# Patient Record
Sex: Female | Born: 1955 | Race: White | Hispanic: No | Marital: Married | Smoking: Current every day smoker
Health system: Southern US, Community
[De-identification: ages and names within clinical notes are randomized; demographics above are authoritative.]

## PROBLEM LIST (undated history)

## (undated) DIAGNOSIS — F419 Anxiety disorder, unspecified: Secondary | ICD-10-CM

## (undated) DIAGNOSIS — E079 Disorder of thyroid, unspecified: Secondary | ICD-10-CM

## (undated) DIAGNOSIS — E559 Vitamin D deficiency, unspecified: Secondary | ICD-10-CM

## (undated) DIAGNOSIS — C801 Malignant (primary) neoplasm, unspecified: Secondary | ICD-10-CM

## (undated) DIAGNOSIS — D219 Benign neoplasm of connective and other soft tissue, unspecified: Secondary | ICD-10-CM

## (undated) DIAGNOSIS — M8580 Other specified disorders of bone density and structure, unspecified site: Secondary | ICD-10-CM

## (undated) HISTORY — PX: HAND SURGERY: SHX662

## (undated) HISTORY — PX: CATARACT EXTRACTION, BILATERAL: SHX1313

## (undated) HISTORY — PX: BREAST BIOPSY: SHX20

## (undated) HISTORY — DX: Disorder of thyroid, unspecified: E07.9

## (undated) HISTORY — PX: BREAST SURGERY: SHX581

## (undated) HISTORY — PX: ABDOMINAL SURGERY: SHX537

## (undated) HISTORY — DX: Anxiety disorder, unspecified: F41.9

## (undated) HISTORY — DX: Other specified disorders of bone density and structure, unspecified site: M85.80

## (undated) HISTORY — DX: Malignant (primary) neoplasm, unspecified: C80.1

## (undated) HISTORY — DX: Vitamin D deficiency, unspecified: E55.9

## (undated) HISTORY — PX: DILATION AND CURETTAGE OF UTERUS: SHX78

## (undated) HISTORY — DX: Benign neoplasm of connective and other soft tissue, unspecified: D21.9

## (undated) HISTORY — PX: TONSILLECTOMY AND ADENOIDECTOMY: SUR1326

---

## 1997-08-31 ENCOUNTER — Ambulatory Visit (HOSPITAL_COMMUNITY): Admission: RE | Admit: 1997-08-31 | Discharge: 1997-08-31 | Payer: Self-pay | Admitting: *Deleted

## 2000-10-18 ENCOUNTER — Encounter: Payer: Self-pay | Admitting: *Deleted

## 2000-10-18 ENCOUNTER — Ambulatory Visit (HOSPITAL_COMMUNITY): Admission: RE | Admit: 2000-10-18 | Discharge: 2000-10-18 | Payer: Self-pay | Admitting: *Deleted

## 2000-12-04 ENCOUNTER — Other Ambulatory Visit: Admission: RE | Admit: 2000-12-04 | Discharge: 2000-12-04 | Payer: Self-pay | Admitting: *Deleted

## 2002-03-16 ENCOUNTER — Ambulatory Visit (HOSPITAL_COMMUNITY): Admission: RE | Admit: 2002-03-16 | Discharge: 2002-03-16 | Payer: Self-pay | Admitting: *Deleted

## 2002-03-16 ENCOUNTER — Encounter: Payer: Self-pay | Admitting: *Deleted

## 2002-03-26 ENCOUNTER — Encounter: Admission: RE | Admit: 2002-03-26 | Discharge: 2002-03-26 | Payer: Self-pay | Admitting: *Deleted

## 2002-03-26 ENCOUNTER — Encounter: Payer: Self-pay | Admitting: *Deleted

## 2002-03-26 ENCOUNTER — Other Ambulatory Visit: Admission: RE | Admit: 2002-03-26 | Discharge: 2002-03-26 | Payer: Self-pay | Admitting: *Deleted

## 2003-03-31 ENCOUNTER — Other Ambulatory Visit: Admission: RE | Admit: 2003-03-31 | Discharge: 2003-03-31 | Payer: Self-pay | Admitting: *Deleted

## 2003-05-21 ENCOUNTER — Encounter: Admission: RE | Admit: 2003-05-21 | Discharge: 2003-05-21 | Payer: Self-pay | Admitting: *Deleted

## 2003-05-21 ENCOUNTER — Encounter: Payer: Self-pay | Admitting: *Deleted

## 2005-01-22 ENCOUNTER — Ambulatory Visit (HOSPITAL_COMMUNITY): Admission: RE | Admit: 2005-01-22 | Discharge: 2005-01-22 | Payer: Self-pay | Admitting: *Deleted

## 2005-02-01 ENCOUNTER — Other Ambulatory Visit: Admission: RE | Admit: 2005-02-01 | Discharge: 2005-02-01 | Payer: Self-pay | Admitting: *Deleted

## 2006-07-31 ENCOUNTER — Other Ambulatory Visit: Admission: RE | Admit: 2006-07-31 | Discharge: 2006-07-31 | Payer: Self-pay | Admitting: Obstetrics and Gynecology

## 2006-08-14 ENCOUNTER — Ambulatory Visit (HOSPITAL_COMMUNITY): Admission: RE | Admit: 2006-08-14 | Discharge: 2006-08-14 | Payer: Self-pay | Admitting: Obstetrics and Gynecology

## 2007-11-03 ENCOUNTER — Encounter: Admission: RE | Admit: 2007-11-03 | Discharge: 2007-11-03 | Payer: Self-pay | Admitting: Obstetrics and Gynecology

## 2008-05-18 ENCOUNTER — Other Ambulatory Visit: Admission: RE | Admit: 2008-05-18 | Discharge: 2008-05-18 | Payer: Self-pay | Admitting: Obstetrics and Gynecology

## 2008-06-30 ENCOUNTER — Ambulatory Visit: Payer: Self-pay | Admitting: Family Medicine

## 2008-06-30 DIAGNOSIS — S6390XA Sprain of unspecified part of unspecified wrist and hand, initial encounter: Secondary | ICD-10-CM | POA: Insufficient documentation

## 2010-05-02 ENCOUNTER — Encounter: Admission: RE | Admit: 2010-05-02 | Discharge: 2010-05-02 | Payer: Self-pay | Admitting: Obstetrics and Gynecology

## 2011-11-20 ENCOUNTER — Other Ambulatory Visit: Payer: Self-pay | Admitting: Family Medicine

## 2011-11-20 DIAGNOSIS — Z0000 Encounter for general adult medical examination without abnormal findings: Secondary | ICD-10-CM

## 2011-11-27 ENCOUNTER — Ambulatory Visit
Admission: RE | Admit: 2011-11-27 | Discharge: 2011-11-27 | Disposition: A | Discharge status: Home / Self Care | Payer: BC Managed Care – PPO | Source: Ambulatory Visit | Attending: Family Medicine | Admitting: Family Medicine

## 2011-11-27 DIAGNOSIS — Z0000 Encounter for general adult medical examination without abnormal findings: Secondary | ICD-10-CM

## 2013-04-15 ENCOUNTER — Other Ambulatory Visit: Payer: Self-pay | Admitting: Family Medicine

## 2013-04-15 DIAGNOSIS — Z1231 Encounter for screening mammogram for malignant neoplasm of breast: Secondary | ICD-10-CM

## 2013-04-23 ENCOUNTER — Ambulatory Visit (INDEPENDENT_AMBULATORY_CARE_PROVIDER_SITE_OTHER): Payer: BC Managed Care – PPO

## 2013-04-23 DIAGNOSIS — Z1231 Encounter for screening mammogram for malignant neoplasm of breast: Secondary | ICD-10-CM

## 2013-08-06 ENCOUNTER — Telehealth: Payer: Self-pay | Admitting: Obstetrics and Gynecology

## 2013-08-06 NOTE — Telephone Encounter (Signed)
Refill request for DIAZEPAM Last filled by MD on - 08/29/12 #30 X 0 RF Last AEX - 08/29/12 with Dr. Joan Flores Next AEX - cancelled appt for 09/04/13 with Dr. Quincy Simmonds, did not reschedule. Please advise refills.

## 2013-08-06 NOTE — Telephone Encounter (Signed)
I spoke to the patient and advised she will need to schedule AEX in order to receive refills.  Pt states she is switching her care over to her PCP and will get RX from them.

## 2013-08-06 NOTE — Telephone Encounter (Signed)
Pharmacy calling for refill on pt's diazepam. [facility] in [place] at [phone].

## 2013-08-06 NOTE — Telephone Encounter (Signed)
Please contact patient to reschedule annual exam. If she does not have an appointment to continue her care here, I cannot refill.

## 2013-09-04 ENCOUNTER — Ambulatory Visit: Payer: Self-pay | Admitting: Obstetrics and Gynecology

## 2014-12-29 ENCOUNTER — Other Ambulatory Visit: Payer: Self-pay | Admitting: Family Medicine

## 2014-12-29 DIAGNOSIS — Z9289 Personal history of other medical treatment: Secondary | ICD-10-CM

## 2014-12-30 ENCOUNTER — Ambulatory Visit (INDEPENDENT_AMBULATORY_CARE_PROVIDER_SITE_OTHER): Payer: BLUE CROSS/BLUE SHIELD

## 2014-12-30 DIAGNOSIS — Z1231 Encounter for screening mammogram for malignant neoplasm of breast: Secondary | ICD-10-CM | POA: Diagnosis not present

## 2014-12-30 DIAGNOSIS — Z9289 Personal history of other medical treatment: Secondary | ICD-10-CM

## 2016-01-03 ENCOUNTER — Other Ambulatory Visit: Payer: Self-pay | Admitting: Family Medicine

## 2016-01-03 DIAGNOSIS — Z1231 Encounter for screening mammogram for malignant neoplasm of breast: Secondary | ICD-10-CM

## 2016-01-04 ENCOUNTER — Ambulatory Visit (INDEPENDENT_AMBULATORY_CARE_PROVIDER_SITE_OTHER): Payer: BLUE CROSS/BLUE SHIELD

## 2016-01-04 DIAGNOSIS — Z1231 Encounter for screening mammogram for malignant neoplasm of breast: Secondary | ICD-10-CM

## 2016-02-29 ENCOUNTER — Ambulatory Visit (INDEPENDENT_AMBULATORY_CARE_PROVIDER_SITE_OTHER): Payer: BLUE CROSS/BLUE SHIELD | Admitting: Obstetrics and Gynecology

## 2016-02-29 ENCOUNTER — Encounter: Payer: Self-pay | Admitting: Obstetrics and Gynecology

## 2016-02-29 VITALS — BP 132/60 | HR 84 | Ht 65.0 in | Wt 136.0 lb

## 2016-02-29 DIAGNOSIS — M8580 Other specified disorders of bone density and structure, unspecified site: Secondary | ICD-10-CM | POA: Diagnosis not present

## 2016-02-29 DIAGNOSIS — Z124 Encounter for screening for malignant neoplasm of cervix: Secondary | ICD-10-CM

## 2016-02-29 DIAGNOSIS — E785 Hyperlipidemia, unspecified: Secondary | ICD-10-CM | POA: Insufficient documentation

## 2016-02-29 DIAGNOSIS — Z720 Tobacco use: Secondary | ICD-10-CM | POA: Diagnosis not present

## 2016-02-29 DIAGNOSIS — Z01419 Encounter for gynecological examination (general) (routine) without abnormal findings: Secondary | ICD-10-CM | POA: Diagnosis not present

## 2016-02-29 DIAGNOSIS — C801 Malignant (primary) neoplasm, unspecified: Secondary | ICD-10-CM | POA: Insufficient documentation

## 2016-02-29 DIAGNOSIS — E079 Disorder of thyroid, unspecified: Secondary | ICD-10-CM | POA: Insufficient documentation

## 2016-02-29 DIAGNOSIS — F17200 Nicotine dependence, unspecified, uncomplicated: Secondary | ICD-10-CM

## 2016-02-29 DIAGNOSIS — F419 Anxiety disorder, unspecified: Secondary | ICD-10-CM | POA: Insufficient documentation

## 2016-02-29 NOTE — Progress Notes (Signed)
60 y.o. P2 female. MarriedCaucasianF here for annual exam.  No vaginal bleeding. Sexually active, no pain.     No LMP recorded. Patient is postmenopausal.          Sexually active: Yes.    The current method of family planning is post menopausal status.    Exercising: Yes.    walking Smoker:  yes  Health Maintenance: Pap:  1/2014WNL per patient History of abnormal Pap:  no MMG:  01-04-16 WNL Colonoscopy: 09-02-13 polyps  BMD:  02-15-16 Osteopenia per patient  TDaP:  unsure Gardasil: N/A   reports that she has been smoking Cigarettes.  She has a 22.00 pack-year smoking history. She has never used smokeless tobacco. She reports that she drinks about 4.2 - 8.4 oz of alcohol per week . She reports that she does not use drugs.She is smoking about a 1/2 a pack a day. She has tried multiple times to quit, declines referral to smoking cessation.  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  She has 2 sons, both vaginal deliveries. 2 grand kids, 4 and 1.5 years.   Past Medical History:  Diagnosis Date  . Anxiety   . Cancer ([place])    skin  . Fibroid    s/p myomectomy  . Osteopenia   . Thyroid disease   . Vitamin D deficiency   Squamous cell skin cancer  Past Surgical History:  Procedure Laterality Date  . ABDOMINAL SURGERY     myomectomy  . BREAST SURGERY     breast lump  . DILATION AND CURETTAGE OF UTERUS    . TONSILLECTOMY AND ADENOIDECTOMY      Current Outpatient Prescriptions  Medication Sig Dispense Refill  . diazepam (VALIUM) 5 MG tablet Take by mouth.    . fluticasone (FLONASE) 50 MCG/ACT nasal spray Two sprays in each nostril daily    . levothyroxine (SYNTHROID, LEVOTHROID) 75 MCG tablet Take by mouth.    . rosuvastatin (CRESTOR) 20 MG tablet Take 20 mg by mouth daily.  1  . vitamin B-12 (CYANOCOBALAMIN) 1000 MCG tablet Take by mouth.    . Vitamin D, Ergocalciferol, (DRISDOL) 50000 units CAPS capsule Take by mouth.     No current facility-administered  medications for this visit.     Family History  Problem Relation Age of Onset  . Adopted: Yes  . Family history unknown: Yes    Review of Systems  Constitutional: Negative.   HENT: Negative.   Eyes: Negative.   Respiratory: Negative.   Cardiovascular: Negative.   Gastrointestinal: Negative.   Endocrine: Negative.   Genitourinary: Negative.   Musculoskeletal: Negative.   Skin: Negative.   Allergic/Immunologic: Negative.   Neurological: Negative.   Psychiatric/Behavioral: Negative.     Exam:   BP 132/60 (BP Location: Right Arm, Patient Position: Sitting, Cuff Size: Normal)   Pulse 84   Ht 5\' 5"  (1.651 m)   Wt 136 lb (61.7 kg)   BMI 22.63 kg/m   Weight change: @WEIGHTCHANGE @ Height:   Height: 5\' 5"  (165.1 cm)  Ht Readings from Last 3 Encounters:  02/29/16 5\' 5"  (1.651 m)  06/30/08 5' 5.5" (1.664 m)    General appearance: alert, cooperative and appears stated age Head: Normocephalic, without obvious abnormality, atraumatic Neck: no adenopathy, supple, symmetrical, trachea midline and thyroid normal to inspection and palpation Lungs: clear to auscultation bilaterally Breasts: normal appearance, no masses or tenderness Heart: regular rate and rhythm Abdomen: soft, non-tender; bowel sounds normal; no masses,  no organomegaly  Extremities: extremities normal, atraumatic, no cyanosis or edema Skin: Skin color, texture, turgor normal. No rashes or lesions Lymph nodes: Cervical, supraclavicular, and axillary nodes normal. No abnormal inguinal nodes palpated Neurologic: Grossly normal   Pelvic: External genitalia:  no lesions              Urethra:  normal appearing urethra with no masses, tenderness or lesions              Bartholins and Skenes: normal                 Vagina: normal appearing vagina with normal color and discharge, no lesions              Cervix: no lesions               Bimanual Exam:  Uterus:  normal size, contour, position, consistency, mobility,  non-tender              Adnexa: no mass, fullness, tenderness               Rectovaginal: Confirms               Anus:  normal sphincter tone, no lesions  Chaperone was present for exam.  A:  Well Woman with normal exam  Screening for cervical cancer  Smoker, declines quitting  P:   Pap with hpv  Labs and immunizations with primary MD  Just had a bone density, osteopenia  Mammogram just done  Colonoscopy in 10/15, due in 5 years  Getting calcium and vit D, discussed amount of calcium and exercise  Discussed breast self awareness

## 2016-02-29 NOTE — Patient Instructions (Signed)

## 2016-03-02 LAB — IPS PAP TEST WITH HPV

## 2017-03-04 ENCOUNTER — Ambulatory Visit: Payer: BLUE CROSS/BLUE SHIELD | Admitting: Obstetrics and Gynecology

## 2017-04-19 ENCOUNTER — Other Ambulatory Visit: Payer: Self-pay | Admitting: Family Medicine

## 2017-04-19 DIAGNOSIS — Z1231 Encounter for screening mammogram for malignant neoplasm of breast: Secondary | ICD-10-CM

## 2017-04-26 ENCOUNTER — Ambulatory Visit (INDEPENDENT_AMBULATORY_CARE_PROVIDER_SITE_OTHER): Payer: BC Managed Care – PPO

## 2017-04-26 DIAGNOSIS — Z1231 Encounter for screening mammogram for malignant neoplasm of breast: Secondary | ICD-10-CM | POA: Diagnosis not present

## 2018-01-03 ENCOUNTER — Ambulatory Visit: Payer: BLUE CROSS/BLUE SHIELD | Admitting: Obstetrics and Gynecology

## 2018-02-05 NOTE — Progress Notes (Signed)
62 y.o. [id number] MarriedCaucasianF here for annual exam. No vaginal bleeding. Sexually active, no pain. She went through a bought where she thought she had IBS. She changed her diet, which helped.      No LMP recorded. Patient is postmenopausal.          Sexually active: Yes.    The current method of family planning is post menopausal. Exercising: No.  The patient does not participate in regular exercise at present. Smoker:  Yes, 1/2 PPD, has tried to quit many times.   Health Maintenance: Pap:  02/29/2016 WNL neg HPV, 1/2014WNL per patient History of abnormal Pap:  no MMG:  04/26/2017 BI-RADS CATEGORY  1: Negative. Colonoscopy: 09-02-13 polyps  BMD:  02-15-16 Osteopenia per patient  TDaP:  Up to date per patient Gardasil: N/A   reports that she has been smoking cigarettes.  She has a 22.00 pack-year smoking history. She has never used smokeless tobacco. She reports that she drinks about 4.2 - 8.4 oz of alcohol per week. She reports that she does not use drugs. Drinks 2-3 glasses of wine in the evening. She is a retired Hospital doctor. 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 Anxiety   . Cancer ([place])    skin  . Fibroid    s/p myomectomy  . Osteopenia   . Thyroid disease   . Vitamin D deficiency   She was hospitalized for a cat bite in the last 1-1.5 years.   Past Surgical History:  Procedure Laterality Date  . ABDOMINAL SURGERY     myomectomy  . BREAST BIOPSY Left   . BREAST SURGERY     breast lump  . CATARACT EXTRACTION, BILATERAL    . DILATION AND CURETTAGE OF UTERUS    . HAND SURGERY    . TONSILLECTOMY AND ADENOIDECTOMY      Current Outpatient Medications  Medication Sig Dispense Refill  . diazepam (VALIUM) 5 MG tablet Take by mouth.    . fluticasone (FLONASE) 50 MCG/ACT nasal spray Two sprays in each nostril daily    . guaifenesin (HUMIBID E) 400 MG TABS tablet Take 400 mg by mouth every 4 (four) hours.    Marland Kitchen ibuprofen (ADVIL,MOTRIN) 200 MG tablet  Take 200 mg by mouth every 6 (six) hours as needed.    . levalbuterol (XOPENEX HFA) 45 MCG/ACT inhaler Inhale 2 puffs into the lungs every 4 (four) hours as needed for wheezing.    Marland Kitchen levothyroxine (SYNTHROID, LEVOTHROID) 75 MCG tablet Take by mouth.    . loratadine (CLARITIN) 10 MG tablet Take 10 mg by mouth daily.    . rosuvastatin (CRESTOR) 20 MG tablet Take 20 mg by mouth daily.  1  . vitamin B-12 (CYANOCOBALAMIN) 1000 MCG tablet Take by mouth.    . Vitamin D, Ergocalciferol, (DRISDOL) 50000 units CAPS capsule Take by mouth.     No current facility-administered medications for this visit.     Family History  Adopted: Yes  Family history unknown: Yes    Review of Systems  Constitutional: Negative.   HENT: Negative.   Eyes: Negative.   Respiratory: Negative.   Cardiovascular: Negative.   Gastrointestinal: Negative.   Endocrine: Negative.   Genitourinary: Negative.   Musculoskeletal: Negative.   Skin: Negative.   Allergic/Immunologic: Negative.   Neurological: Negative.   Hematological: Negative.   Psychiatric/Behavioral: Negative.     Exam:   BP 140/79 (BP Location: Right Arm, Patient Position: Sitting)   Pulse 88  Ht 5' 5.35" (1.66 m)   Wt 136 lb (61.7 kg)   BMI 22.39 kg/m   Weight change: @WEIGHTCHANGE @ Height:   Height: 5' 5.35" (166 cm)  Ht Readings from Last 3 Encounters:  02/06/18 5' 5.35" (1.66 m)  02/29/16 5\' 5"  (1.651 m)    General appearance: alert, cooperative and appears stated age Head: Normocephalic, without obvious abnormality, atraumatic Neck: no adenopathy, supple, symmetrical, trachea midline and thyroid normal to inspection and palpation Lungs: clear to auscultation bilaterally Cardiovascular: regular rate and rhythm Breasts: normal appearance, no masses or tenderness Abdomen: soft, non-tender; non distended,  no masses,  no organomegaly Extremities: extremities normal, atraumatic, no cyanosis or edema Skin: Skin color, texture, turgor normal.  No rashes or lesions Lymph nodes: Cervical, supraclavicular, and axillary nodes normal. No abnormal inguinal nodes palpated Neurologic: Grossly normal   Pelvic: External genitalia:  no lesions              Urethra:  normal appearing urethra with no masses, tenderness or lesions              Bartholins and Skenes: normal                 Vagina: normal appearing vagina with normal color and discharge, no lesions              Cervix: no lesions               Bimanual Exam:  Uterus:  normal size, contour, position, consistency, mobility, non-tender and anteverted              Adnexa: no mass, fullness, tenderness               Rectovaginal: Confirms               Anus:  normal sphincter tone, no lesions  Chaperone was present for exam.  A:  Well Woman with normal exam  Smoker, no current plans to quit  P:   No pap this year  Labs with primary  Colonoscopy UTD  DEXA with primary  Discussed breast self exam  Discussed calcium and vit D intake  Discussed ETOH intake recommendations

## 2018-02-06 ENCOUNTER — Ambulatory Visit (INDEPENDENT_AMBULATORY_CARE_PROVIDER_SITE_OTHER): Payer: BC Managed Care – PPO | Admitting: Obstetrics and Gynecology

## 2018-02-06 ENCOUNTER — Other Ambulatory Visit: Payer: Self-pay

## 2018-02-06 ENCOUNTER — Encounter: Payer: Self-pay | Admitting: Obstetrics and Gynecology

## 2018-02-06 VITALS — BP 140/79 | HR 88 | Ht 65.35 in | Wt 136.0 lb

## 2018-02-06 DIAGNOSIS — Z01419 Encounter for gynecological examination (general) (routine) without abnormal findings: Secondary | ICD-10-CM | POA: Diagnosis not present

## 2018-02-06 DIAGNOSIS — F17200 Nicotine dependence, unspecified, uncomplicated: Secondary | ICD-10-CM

## 2018-02-06 NOTE — Patient Instructions (Signed)
EXERCISE AND DIET:  We recommended that you start or continue a regular exercise program for good health. Regular exercise means any activity that makes your heart beat faster and makes you sweat.  We recommend exercising at least 30 minutes per day at least 3 days a week, preferably 4 or 5.  We also recommend a diet low in fat and sugar.  Inactivity, poor dietary choices and obesity can cause diabetes, heart attack, stroke, and kidney damage, among others.    ALCOHOL AND SMOKING:  Women should limit their alcohol intake to no more than 7 drinks/beers/glasses of wine (combined, not each!) per week. Moderation of alcohol intake to this level decreases your risk of breast cancer and liver damage. And of course, no recreational drugs are part of a healthy lifestyle.  And absolutely no smoking or even second hand smoke. Most people know smoking can cause heart and lung diseases, but did you know it also contributes to weakening of your bones? Aging of your skin?  Yellowing of your teeth and nails?  CALCIUM AND VITAMIN D:  Adequate intake of calcium and Vitamin D are recommended.  The recommendations for exact amounts of these supplements seem to change often, but generally speaking 600 mg of calcium (either carbonate or citrate) and 800 units of Vitamin D per day seems prudent. Certain women may benefit from higher intake of Vitamin D.  If you are among these women, your doctor will have told you during your visit.    PAP SMEARS:  Pap smears, to check for cervical cancer or precancers,  have traditionally been done yearly, although recent scientific advances have shown that most women can have pap smears less often.  However, every woman still should have a physical exam from her gynecologist every year. It will include a breast check, inspection of the vulva and vagina to check for abnormal growths or skin changes, a visual exam of the cervix, and then an exam to evaluate the size and shape of the uterus and  ovaries.  And after 63 years of age, a rectal exam is indicated to check for rectal cancers. We will also provide age appropriate advice regarding health maintenance, like when you should have certain vaccines, screening for sexually transmitted diseases, bone density testing, colonoscopy, mammograms, etc.   MAMMOGRAMS:  All women over 40 years old should have a yearly mammogram. Many facilities now offer a "3D" mammogram, which may cost around $50 extra out of pocket. If possible,  we recommend you accept the option to have the 3D mammogram performed.  It both reduces the number of women who will be called back for extra views which then turn out to be normal, and it is better than the routine mammogram at detecting truly abnormal areas.    COLONOSCOPY:  Colonoscopy to screen for colon cancer is recommended for all women at age 50.  We know, you hate the idea of the prep.  We agree, BUT, having colon cancer and not knowing it is worse!!  Colon cancer so often starts as a polyp that can be seen and removed at colonscopy, which can quite literally save your life!  And if your first colonoscopy is normal and you have no family history of colon cancer, most women don't have to have it again for 10 years.  Once every ten years, you can do something that may end up saving your life, right?  We will be happy to help you get it scheduled when you are ready.    Be sure to check your insurance coverage so you understand how much it will cost.  It may be covered as a preventative service at no cost, but you should check your particular policy.      Breast Self-Awareness Breast self-awareness means being familiar with how your breasts look and feel. It involves checking your breasts regularly and reporting any changes to your health care provider. Practicing breast self-awareness is important. A change in your breasts can be a sign of a serious medical problem. Being familiar with how your breasts look and feel allows  you to find any problems early, when treatment is more likely to be successful. All women should practice breast self-awareness, including women who have had breast implants. How to do a breast self-exam One way to learn what is normal for your breasts and whether your breasts are changing is to do a breast self-exam. To do a breast self-exam: Look for Changes  1. Remove all the clothing above your waist. 2. Stand in front of a mirror in a room with good lighting. 3. Put your hands on your hips. 4. Push your hands firmly downward. 5. Compare your breasts in the mirror. Look for differences between them (asymmetry), such as: ? Differences in shape. ? Differences in size. ? Puckers, dips, and bumps in one breast and not the other. 6. Look at each breast for changes in your skin, such as: ? Redness. ? Scaly areas. 7. Look for changes in your nipples, such as: ? Discharge. ? Bleeding. ? Dimpling. ? Redness. ? A change in position. Feel for Changes  Carefully feel your breasts for lumps and changes. It is best to do this while lying on your back on the floor and again while sitting or standing in the shower or tub with soapy water on your skin. Feel each breast in the following way:  Place the arm on the side of the breast you are examining above your head.  Feel your breast with the other hand.  Start in the nipple area and make  inch (2 cm) overlapping circles to feel your breast. Use the pads of your three middle fingers to do this. Apply light pressure, then medium pressure, then firm pressure. The light pressure will allow you to feel the tissue closest to the skin. The medium pressure will allow you to feel the tissue that is a little deeper. The firm pressure will allow you to feel the tissue close to the ribs.  Continue the overlapping circles, moving downward over the breast until you feel your ribs below your breast.  Move one finger-width toward the center of the body.  Continue to use the  inch (2 cm) overlapping circles to feel your breast as you move slowly up toward your collarbone.  Continue the up and down exam using all three pressures until you reach your armpit.  Write Down What You Find  Write down what is normal for each breast and any changes that you find. Keep a written record with breast changes or normal findings for each breast. By writing this information down, you do not need to depend only on memory for size, tenderness, or location. Write down where you are in your menstrual cycle, if you are still menstruating. If you are having trouble noticing differences in your breasts, do not get discouraged. With time you will become more familiar with the variations in your breasts and more comfortable with the exam. How often should I examine my breasts? Examine   your breasts every month. If you are breastfeeding, the best time to examine your breasts is after a feeding or after using a breast pump. If you menstruate, the best time to examine your breasts is 5-7 days after your period is over. During your period, your breasts are lumpier, and it may be more difficult to notice changes. When should I see my health care provider? See your health care provider if you notice:  A change in shape or size of your breasts or nipples.  A change in the skin of your breast or nipples, such as a reddened or scaly area.  Unusual discharge from your nipples.  A lump or thick area that was not there before.  Pain in your breasts.  Anything that concerns you.  This information is not intended to replace advice given to you by your health care provider. Make sure you discuss any questions you have with your health care provider. Document Released: 07/16/2005 Document Revised: 12/22/2015 Document Reviewed: 06/05/2015 Elsevier Interactive Patient Education  2018 [employer]  

## 2019-02-09 NOTE — Progress Notes (Signed)
63 y.o. [id number] Married White or Caucasian Not Hispanic or Latino female here for annual exam.  No vaginal bleeding. Sexually active, okay with lubricant.     No LMP recorded. Patient is postmenopausal.          Sexually active: Yes.    The current method of family planning is post menopausal status.    Exercising: Yes.    yard work, walking dog Smoker:  Yes, 1/2 PPD  Health Maintenance: Pap:02/29/2016 WNL neg HPV, 1/2014WNL per patient History of abnormal Pap:no MMG:04/26/2017 BI-RADS CATEGORY 1: Negative. Colonoscopy:09-17-2018- polyps, f/u 3 years BMD:05-28-18 osteopenia, FRAX 12.7/3.8%. She is on Zoledronic Acid.  TDaP:Up to date per patient Gardasil:N/A   reports that she has been smoking cigarettes. She has a 22.00 pack-year smoking history. She has never used smokeless tobacco. She reports current alcohol use of about 7.0 - 14.0 standard drinks of alcohol per week. She reports that she does not use drugs. She is a retired Hospital doctor. 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 Anxiety   . Cancer ([place])    skin  . Fibroid    s/p myomectomy  . Osteopenia   . Thyroid disease   . Vitamin D deficiency     Past Surgical History:  Procedure Laterality Date  . ABDOMINAL SURGERY     myomectomy  . BREAST BIOPSY Left   . BREAST SURGERY     breast lump  . CATARACT EXTRACTION, BILATERAL    . DILATION AND CURETTAGE OF UTERUS    . HAND SURGERY    . TONSILLECTOMY AND ADENOIDECTOMY      Current Outpatient Medications  Medication Sig Dispense Refill  . diazepam (VALIUM) 5 MG tablet Take by mouth.    . fluticasone (FLONASE) 50 MCG/ACT nasal spray Two sprays in each nostril daily    . ibuprofen (ADVIL,MOTRIN) 200 MG tablet Take 200 mg by mouth every 6 (six) hours as needed.    Marland Kitchen levothyroxine (SYNTHROID, LEVOTHROID) 75 MCG tablet Take by mouth.    . loratadine (CLARITIN) 10 MG tablet Take 10 mg by mouth daily.    . Multiple Minerals-Vitamins  (CITRACAL MAXIMUM PLUS) TABS     . rosuvastatin (CRESTOR) 20 MG tablet Take 20 mg by mouth daily.  1  . vitamin B-12 (CYANOCOBALAMIN) 1000 MCG tablet Take by mouth.    . Vitamin D, Ergocalciferol, (DRISDOL) 50000 units CAPS capsule Take by mouth.    . zoledronic acid (RECLAST) 5 MG/100ML SOLN injection Inject into the vein.     No current facility-administered medications for this visit.     Family History  Adopted: Yes  Family history unknown: Yes    Review of Systems  Constitutional: Negative.   HENT: Negative.   Eyes: Negative.   Respiratory: Negative.   Cardiovascular: Negative.   Gastrointestinal: Negative.   Endocrine: Negative.   Genitourinary: Negative.   Musculoskeletal: Negative.   Skin: Negative.   Allergic/Immunologic: Negative.   Neurological: Negative.   Hematological: Negative.   Psychiatric/Behavioral: Negative.     Exam:   BP 130/70 (BP Location: Left Arm, Patient Position: Sitting, Cuff Size: Normal)   Pulse 82   Temp 98.7 F (37.1 C) (Temporal)   Resp 14   Ht 5' 5.5" (1.664 m)   Wt 135 lb 1.6 oz (61.3 kg)   BMI 22.14 kg/m   Weight change: @WEIGHTCHANGE @ Height:   Height: 5' 5.5" (166.4 cm)  Ht Readings from Last 3 Encounters:  02/11/19 5'  5.5" (1.664 m)  02/06/18 5' 5.35" (1.66 m)  02/29/16 5\' 5"  (1.651 m)    General appearance: alert, cooperative and appears stated age Head: Normocephalic, without obvious abnormality, atraumatic Neck: no adenopathy, supple, symmetrical, trachea midline and thyroid normal to inspection and palpation Lungs: clear to auscultation bilaterally Cardiovascular: regular rate and rhythm Breasts: normal appearance, no masses or tenderness Abdomen: soft, non-tender; non distended,  no masses,  no organomegaly Extremities: extremities normal, atraumatic, no cyanosis or edema Skin: Skin color, texture, turgor normal. No rashes or lesions Lymph nodes: Cervical, supraclavicular, and axillary nodes normal. No abnormal  inguinal nodes palpated Neurologic: Grossly normal   Pelvic: External genitalia:  no lesions              Urethra:  normal appearing urethra with no masses, tenderness or lesions              Bartholins and Skenes: normal                 Vagina: attrophic appearing vagina with normal color and discharge, no lesions              Cervix: no lesions               Bimanual Exam:  Uterus:  normal size, contour, position, consistency, mobility, non-tender              Adnexa: no mass, fullness, tenderness               Rectovaginal: Confirms               Anus:  normal sphincter tone, no lesions  Chaperone was present for exam.  A:  Well Woman with normal exam  Osteopenia with elevated risk of fracture  Aware of guidelines for ETOH use, had elevated LFT's, but they normalized    P:   Pap with hpv  Discussed breast self exam  Discussed calcium and vit D intake  Colonoscopy UTD  Mammogram overdue  Getting reclast with her primary  Labs with her primary

## 2019-02-11 ENCOUNTER — Encounter: Payer: Self-pay | Admitting: Obstetrics and Gynecology

## 2019-02-11 ENCOUNTER — Other Ambulatory Visit: Payer: Self-pay

## 2019-02-11 ENCOUNTER — Ambulatory Visit: Payer: BC Managed Care – PPO | Admitting: Obstetrics and Gynecology

## 2019-02-11 ENCOUNTER — Other Ambulatory Visit (HOSPITAL_COMMUNITY)
Admission: RE | Admit: 2019-02-11 | Discharge: 2019-02-11 | Disposition: A | Discharge status: Home / Self Care | Payer: BC Managed Care – PPO | Source: Ambulatory Visit | Attending: Obstetrics and Gynecology | Admitting: Obstetrics and Gynecology

## 2019-02-11 VITALS — BP 130/70 | HR 82 | Temp 98.7°F | Resp 14 | Ht 65.5 in | Wt 135.1 lb

## 2019-02-11 DIAGNOSIS — Z01419 Encounter for gynecological examination (general) (routine) without abnormal findings: Secondary | ICD-10-CM | POA: Diagnosis not present

## 2019-02-11 DIAGNOSIS — M8580 Other specified disorders of bone density and structure, unspecified site: Secondary | ICD-10-CM | POA: Diagnosis not present

## 2019-02-11 DIAGNOSIS — Z124 Encounter for screening for malignant neoplasm of cervix: Secondary | ICD-10-CM

## 2019-02-11 NOTE — Patient Instructions (Signed)

## 2019-02-13 LAB — CYTOLOGY - PAP
Diagnosis: NEGATIVE
HPV: NOT DETECTED

## 2019-05-04 ENCOUNTER — Other Ambulatory Visit: Payer: Self-pay | Admitting: Family Medicine

## 2019-05-04 DIAGNOSIS — Z1231 Encounter for screening mammogram for malignant neoplasm of breast: Secondary | ICD-10-CM

## 2019-05-21 ENCOUNTER — Other Ambulatory Visit: Payer: Self-pay

## 2019-05-21 ENCOUNTER — Ambulatory Visit (INDEPENDENT_AMBULATORY_CARE_PROVIDER_SITE_OTHER): Payer: BC Managed Care – PPO

## 2019-05-21 DIAGNOSIS — Z1231 Encounter for screening mammogram for malignant neoplasm of breast: Secondary | ICD-10-CM

## 2019-05-25 ENCOUNTER — Other Ambulatory Visit: Payer: Self-pay | Admitting: Family Medicine

## 2019-05-25 DIAGNOSIS — R928 Other abnormal and inconclusive findings on diagnostic imaging of breast: Secondary | ICD-10-CM

## 2021-07-07 IMAGING — MG DIGITAL SCREENING BILAT W/ TOMO W/ CAD
6 of 10 series · 6 of 30 positions shown · non-contrast
Comparison: Previous exam(s).

CLINICAL DATA: Screening.

EXAM:
DIGITAL SCREENING BILATERAL MAMMOGRAM WITH TOMO AND CAD

[R MLO synth-2D]
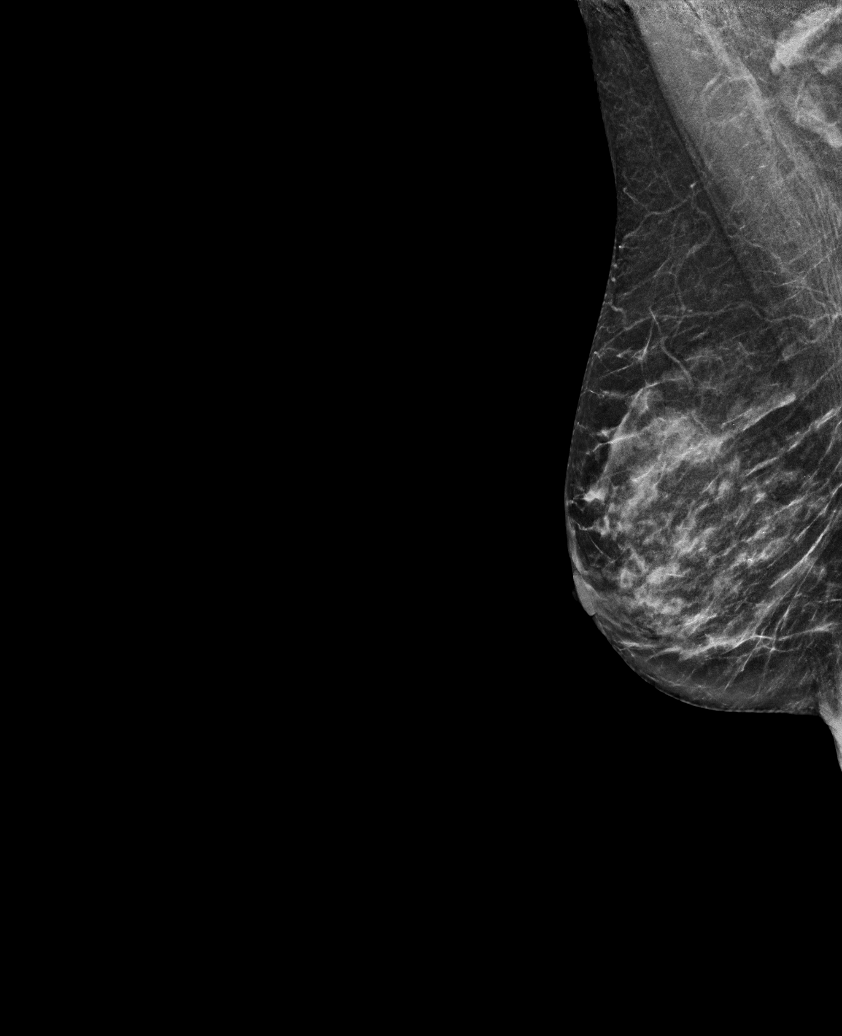

[R XCCL synth-2D]
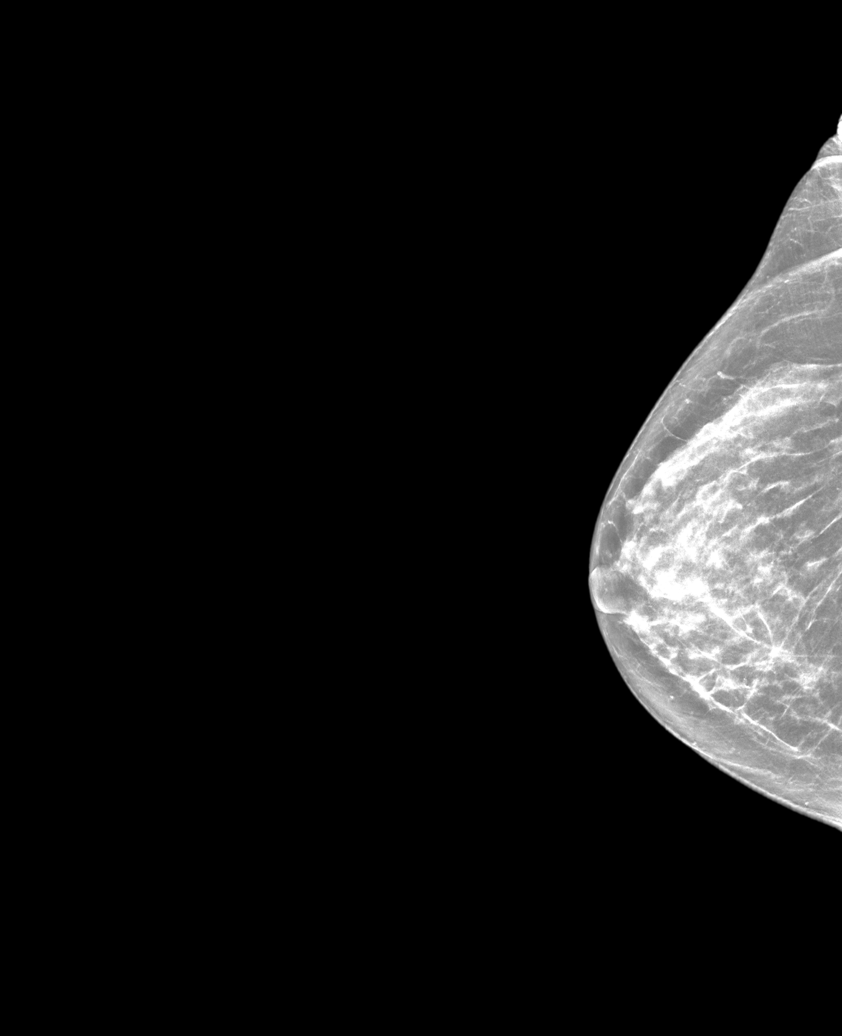

[L MLO synth-2D]
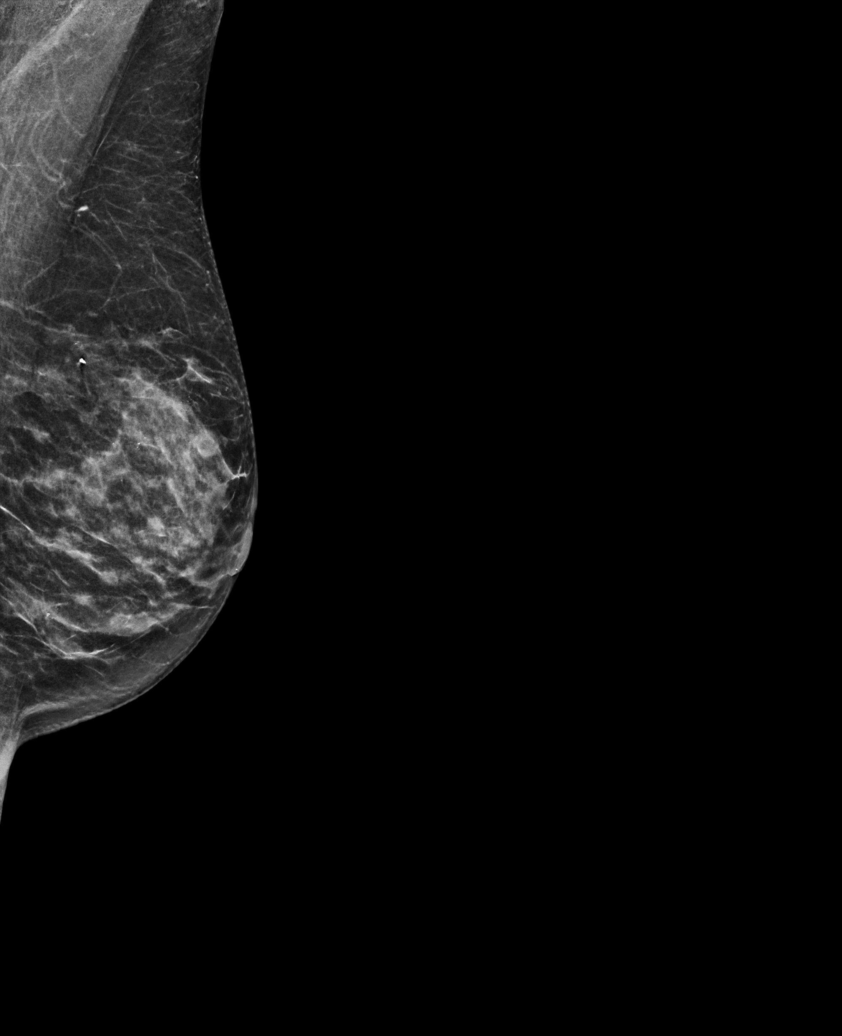

[R CC synth-2D]
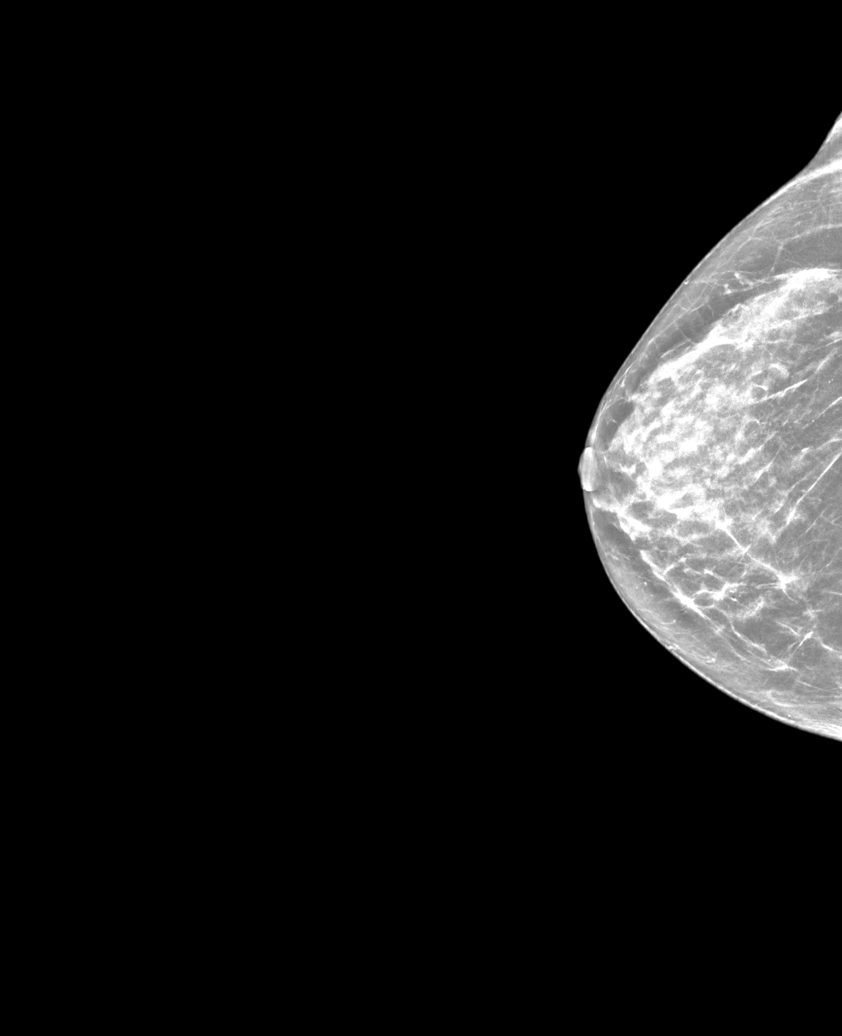

[L CC synth-2D]
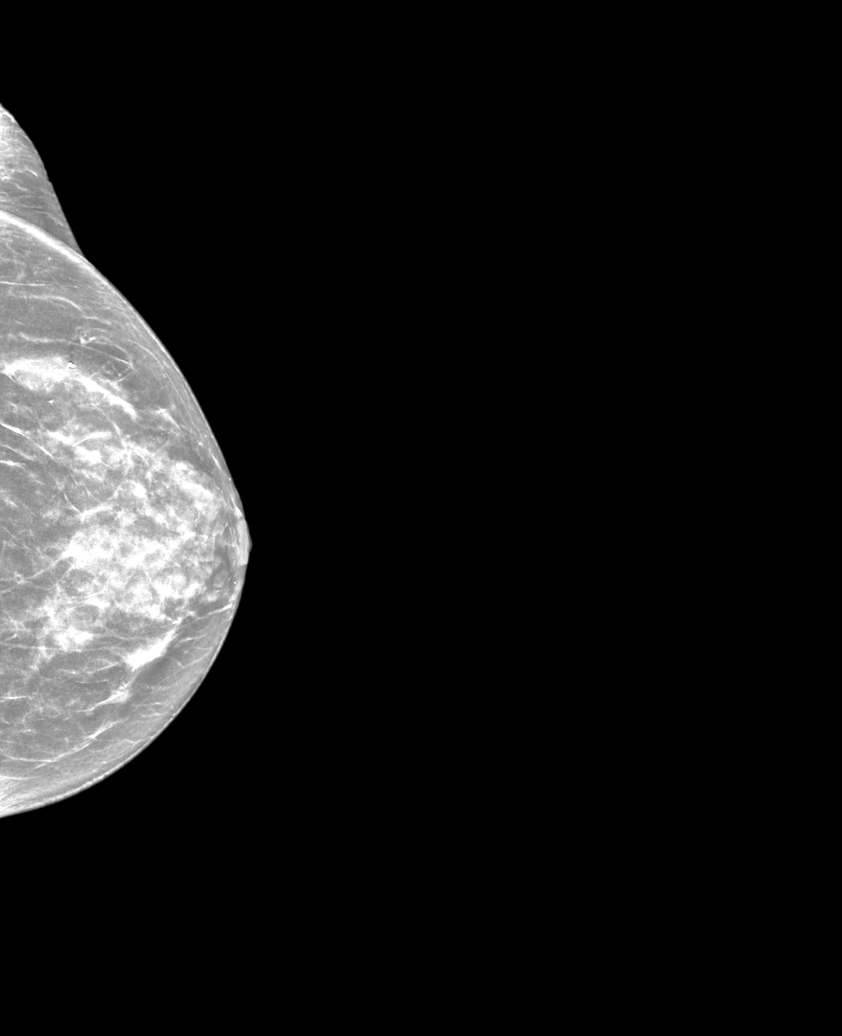

[L CC tomo · tomo slice 28/55.0]
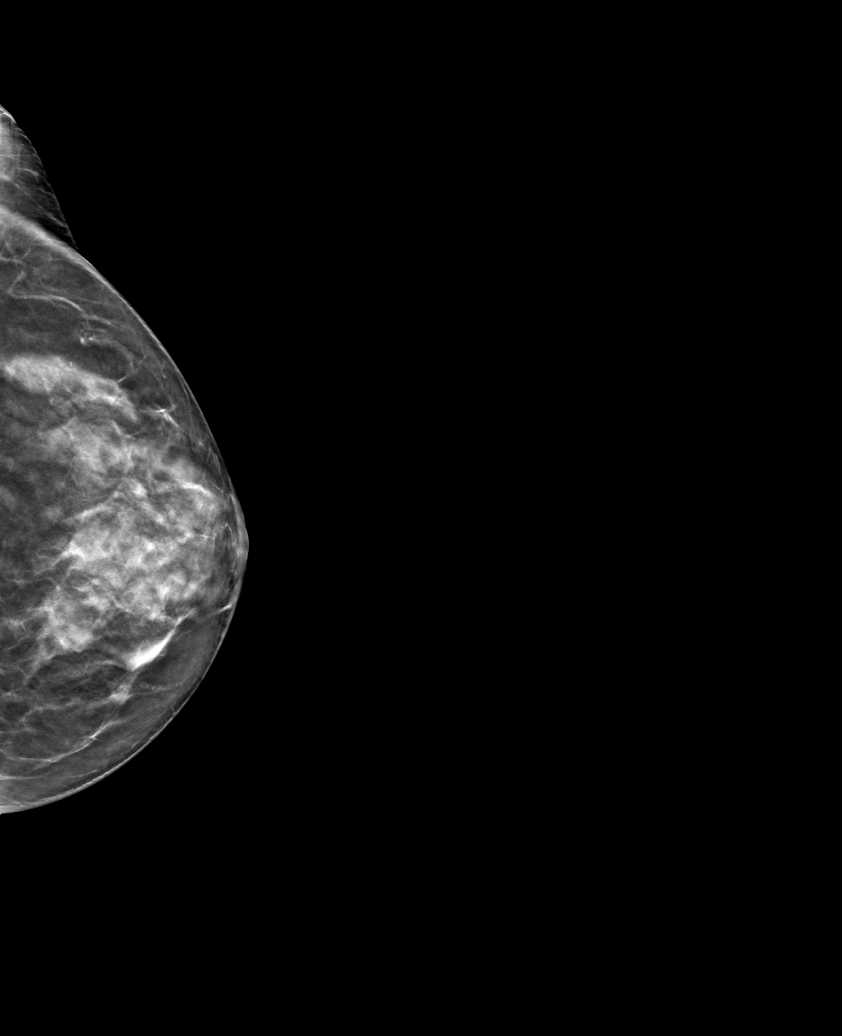

[6 of 30 positions shown; findings below may reference images not displayed]

ACR Breast Density Category d: The breast tissue is extremely dense,
which lowers the sensitivity of mammography.
FINDINGS: In the left breast, possible distortion warrants further evaluation.
In the right breast, no findings suspicious for malignancy. Images
were processed with CAD.
IMPRESSION: Further evaluation is suggested for possible distortion in the left
breast.

RECOMMENDATION:
Diagnostic mammogram and possibly ultrasound of the left breast.
(Code:CV-C-EE3)

The patient will be contacted regarding the findings, and additional
imaging will be scheduled.

BI-RADS CATEGORY  0: Incomplete. Need additional imaging evaluation
and/or prior mammograms for comparison.
